# Patient Record
Sex: Male | Born: 1940 | Race: White | Hispanic: No | Marital: Married | Smoking: Former smoker
Health system: Southern US, Community
[De-identification: ages and names within clinical notes are randomized; demographics above are authoritative.]

## PROBLEM LIST (undated history)

## (undated) DIAGNOSIS — N289 Disorder of kidney and ureter, unspecified: Secondary | ICD-10-CM

## (undated) DIAGNOSIS — E7800 Pure hypercholesterolemia, unspecified: Secondary | ICD-10-CM

## (undated) DIAGNOSIS — C801 Malignant (primary) neoplasm, unspecified: Secondary | ICD-10-CM

## (undated) DIAGNOSIS — I10 Essential (primary) hypertension: Secondary | ICD-10-CM

## (undated) HISTORY — PX: TONSILLECTOMY: SUR1361

## (undated) HISTORY — PX: PROSTATE BIOPSY: SHX241

## (undated) HISTORY — PX: HEMORRHOID SURGERY: SHX153

---

## 2005-03-01 HISTORY — PX: HERNIA REPAIR: SHX51

## 2016-02-24 ENCOUNTER — Other Ambulatory Visit (HOSPITAL_COMMUNITY): Payer: Self-pay | Admitting: Urology

## 2016-02-24 DIAGNOSIS — C61 Malignant neoplasm of prostate: Secondary | ICD-10-CM

## 2016-03-05 ENCOUNTER — Ambulatory Visit (HOSPITAL_COMMUNITY)
Admission: RE | Admit: 2016-03-05 | Discharge: 2016-03-05 | Disposition: A | Discharge status: Home / Self Care | Payer: Medicare Other | Source: Ambulatory Visit | Attending: Urology | Admitting: Urology

## 2016-03-05 DIAGNOSIS — C61 Malignant neoplasm of prostate: Secondary | ICD-10-CM | POA: Diagnosis not present

## 2016-03-05 LAB — POCT I-STAT CREATININE: CREATININE: 1.3 mg/dL — AB (ref 0.61–1.24)

## 2016-03-05 MED ORDER — GADOBENATE DIMEGLUMINE 529 MG/ML IV SOLN
20.0000 mL | Freq: Once | INTRAVENOUS | Status: AC | PRN
  Administered 2016-03-05: 20 mL via INTRAVENOUS

## 2016-03-31 ENCOUNTER — Encounter (HOSPITAL_BASED_OUTPATIENT_CLINIC_OR_DEPARTMENT_OTHER): Payer: Self-pay

## 2016-03-31 ENCOUNTER — Emergency Department (HOSPITAL_BASED_OUTPATIENT_CLINIC_OR_DEPARTMENT_OTHER)
Admission: EM | Admit: 2016-03-31 | Discharge: 2016-03-31 | Disposition: A | Discharge status: Home / Self Care | Payer: Medicare Other | Attending: Emergency Medicine | Admitting: Emergency Medicine

## 2016-03-31 DIAGNOSIS — R319 Hematuria, unspecified: Secondary | ICD-10-CM | POA: Diagnosis not present

## 2016-03-31 DIAGNOSIS — Z8546 Personal history of malignant neoplasm of prostate: Secondary | ICD-10-CM | POA: Diagnosis not present

## 2016-03-31 DIAGNOSIS — R1030 Lower abdominal pain, unspecified: Secondary | ICD-10-CM | POA: Diagnosis not present

## 2016-03-31 DIAGNOSIS — R339 Retention of urine, unspecified: Secondary | ICD-10-CM | POA: Diagnosis not present

## 2016-03-31 DIAGNOSIS — Z87891 Personal history of nicotine dependence: Secondary | ICD-10-CM | POA: Diagnosis not present

## 2016-03-31 DIAGNOSIS — N183 Chronic kidney disease, stage 3 (moderate): Secondary | ICD-10-CM | POA: Diagnosis not present

## 2016-03-31 DIAGNOSIS — I129 Hypertensive chronic kidney disease with stage 1 through stage 4 chronic kidney disease, or unspecified chronic kidney disease: Secondary | ICD-10-CM | POA: Insufficient documentation

## 2016-03-31 HISTORY — DX: Pure hypercholesterolemia, unspecified: E78.00

## 2016-03-31 HISTORY — DX: Essential (primary) hypertension: I10

## 2016-03-31 HISTORY — DX: Malignant (primary) neoplasm, unspecified: C80.1

## 2016-03-31 HISTORY — DX: Disorder of kidney and ureter, unspecified: N28.9

## 2016-03-31 MED ORDER — LIDOCAINE HCL 2 % EX GEL
CUTANEOUS | Status: AC
  Filled 2016-03-31: qty 20

## 2016-03-31 NOTE — ED Notes (Signed)
ED Provider at bedside. 

## 2016-03-31 NOTE — ED Notes (Signed)
Pt verbalizes understanding of d/c instructions and denies any further needs at this time. 

## 2016-03-31 NOTE — ED Notes (Signed)
Assisted S. Vogt with F/C and 732mls of red urine

## 2016-03-31 NOTE — ED Notes (Signed)
Foley is still draining urine, no clots present and the urine is clearing up from the frank red when the catheter was first placed.

## 2016-03-31 NOTE — Discharge Instructions (Signed)
Keep the catheter in place until you see Dr. Venia Minks. Return to the ED if you develop new or worsening symptoms.

## 2016-03-31 NOTE — ED Notes (Signed)
Pt. Reports he had a procedure yesterday has not urinated since 1600 yesterday.  Pt. Reports when he was urinating it had multiple clots.  Pt. Reports he feels full of urine.  Pt. Has prostate cancer.

## 2016-03-31 NOTE — ED Triage Notes (Signed)
Pt had a prostate biopsy this morning at 1030, drank lots of fluid and was able to urinate post-procedure, stopped being able to urinate around 1800.  Pt saw Dr. Rosario Adie today for the procedure.  Pt denies difficulty with urinary retention prior to tonight.

## 2016-03-31 NOTE — ED Notes (Signed)
Family at bedside. 

## 2016-03-31 NOTE — ED Provider Notes (Signed)
[facility] Provider Note   CSN: [id number] Arrival date & time: 03/31/16  0017     History   Chief Complaint Chief Complaint  Patient presents with  . Urinary Retention    HPI Preston Rodriguez is a 76 y.o. male.  Patient with history of prostate cancer status post transrectal biopsy today presenting with urinary retention since 4 PM. States he did have blood clots after the procedure. Has now been unable to urinate since about 4 PM. He's had nausea but no vomiting. He has been drinking increased fluids without relief. He denies any chest pain, shortness of breath, fever or chills. He has some lower abdominal fullness and back pain. No focal weakness. Do not have any previous issues with urinary retention prior to this biopsy. He reports nothing was inserted his penis during the procedure.   The history is provided by the patient.    Past Medical History:  Diagnosis Date  . Cancer [facility])    prostate  . High cholesterol   . Hypertension   . Renal disorder    stage three kidney disease    There are no active problems to display for this patient.   Past Surgical History:  Procedure Laterality Date  . HEMORRHOID SURGERY    . HERNIA REPAIR  2007   right groin  . PROSTATE BIOPSY    . TONSILLECTOMY         Home Medications    Prior to Admission medications   Medication Sig Start Date End Date Taking? Authorizing Provider  ezetimibe (ZETIA) 10 MG tablet Take 10 mg by mouth daily.   Yes Historical Provider, MD  fluticasone (FLOVENT DISKUS) 50 MCG/BLIST diskus inhaler Inhale 1 puff into the lungs 2 (two) times daily.   Yes Historical Provider, MD  losartan (COZAAR) 25 MG tablet Take 25 mg by mouth daily.   Yes Historical Provider, MD  triamterene-hydrochlorothiazide (DYAZIDE) 37.5-25 MG capsule Take 1 capsule by mouth daily.   Yes Historical Provider, MD    Family History No family history on file.  Social History Social History  Substance Use Topics  .  Smoking status: Former Research scientist (life sciences)  . Smokeless tobacco: Never Used  . Alcohol use No     Allergies   Statins   Review of Systems Review of Systems  Constitutional: Negative for activity change, appetite change and fever.  Respiratory: Negative for cough, chest tightness and shortness of breath.   Cardiovascular: Negative for chest pain.  Gastrointestinal: Positive for abdominal pain. Negative for nausea and vomiting.  Genitourinary: Positive for decreased urine volume, difficulty urinating, frequency and hematuria.  Musculoskeletal: Negative for arthralgias and myalgias.  Neurological: Negative for dizziness, tremors and headaches.   A complete 10 system review of systems was obtained and all systems are negative except as noted in the HPI and PMH.   Physical Exam Updated Vital Signs BP 148/80 (BP Location: Left Arm)   Pulse 68   Temp 98.3 F (36.8 C) (Oral)   Resp 16   Ht 6' (1.829 m)   Wt 200 lb (90.7 kg)   SpO2 99%   BMI 27.12 kg/m   Physical Exam  Constitutional: He is oriented to person, place, and time. He appears well-developed and well-nourished. No distress.  uncomfortable  HENT:  Head: Normocephalic and atraumatic.  Mouth/Throat: Oropharynx is clear and moist. No oropharyngeal exudate.  Eyes: Conjunctivae and EOM are normal. Pupils are equal, round, and reactive to light.  Neck: Normal range of motion. Neck supple.  No meningismus.  Cardiovascular: Normal rate, regular rhythm, normal heart sounds and intact distal pulses.   No murmur heard. Pulmonary/Chest: Effort normal and breath sounds normal. No respiratory distress.  Abdominal: Soft. There is tenderness. There is no rebound and no guarding.  Suprapubic tenderness  Genitourinary:  Genitourinary Comments: No testicular tenderness  no blood at meatus  Musculoskeletal: Normal range of motion. He exhibits no edema or tenderness.  Neurological: He is alert and oriented to person, place, and time. No cranial  nerve deficit. He exhibits normal muscle tone. Coordination normal.  No ataxia on finger to nose bilaterally. No pronator drift. 5/5 strength throughout. CN 2-12 intact.Equal grip strength. Sensation intact.   Skin: Skin is warm.  Psychiatric: He has a normal mood and affect. His behavior is normal.  Nursing note and vitals reviewed.    ED Treatments / Results  Labs (all labs ordered are listed, but only abnormal results are displayed) Labs Reviewed - No data to display  EKG  EKG Interpretation None       Radiology No results found.  Procedures Procedures (including critical care time)  Medications Ordered in ED Medications - No data to display   Initial Impression / Assessment and Plan / ED Course  I have reviewed the triage vital signs and the nursing notes.  Pertinent labs & imaging results that were available during my care of the patient were reviewed by me and considered in my medical decision making (see chart for details).     Urinary retention after transrectal prostate biopsy today. Uncomfortable. Suspect blood clot causing occlusion of urethra.  Foley placed with >800 ml of red urine.  Irrigated and flushes without clots.   Leg bag placed.  Will discharge with catheter in place to follow up with urologist.  Patient feels improved after catheter placed.  Return precautions discussed.  Final Clinical Impressions(s) / ED Diagnoses   Final diagnoses:  Urinary retention    New Prescriptions New Prescriptions   No medications on file     Ezequiel Essex, MD 03/31/16 [phone]

## 2016-08-29 DEATH — deceased

## 2018-01-26 IMAGING — MR MR PROSTATE WO/W CM
55 series · 55 of 55 positions shown · IV contrast (20 MH)
Comparison: None.

CLINICAL DATA: Rising PSA equal 6.48. Prior prostate gland biopsy
with recent 3+ 3 equals 6 carcinoma.

EXAM:
MR PROSTATE WITHOUT AND WITH CONTRAST
TECHNIQUE: Multiplanar multisequence MRI images were obtained of the pelvis
centered about the prostate. Pre and post contrast images were
obtained.
CONTRAST:  20mL MULTIHANCE GADOBENATE DIMEGLUMINE 529 MG/ML IV SOLN

[Series 6: T1 · axial · 8.0mm · 0.78mm/px · 1 of 30 slices shown (1 of 2)]
[im 1/30]
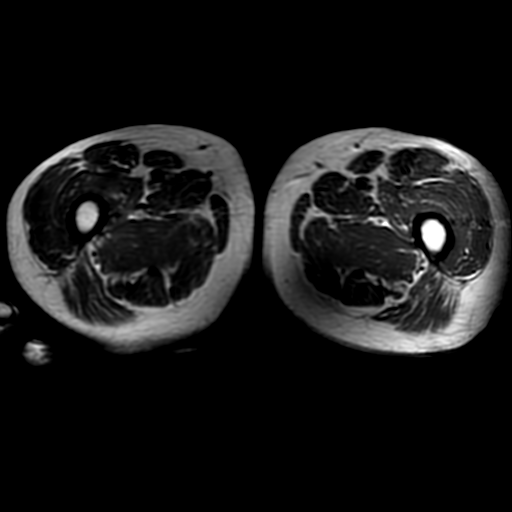

[Series 7: bSSFP fat-sat · axial · 8.0mm · 0.78mm/px · 1 of 30 slices shown]
[im 1/30]
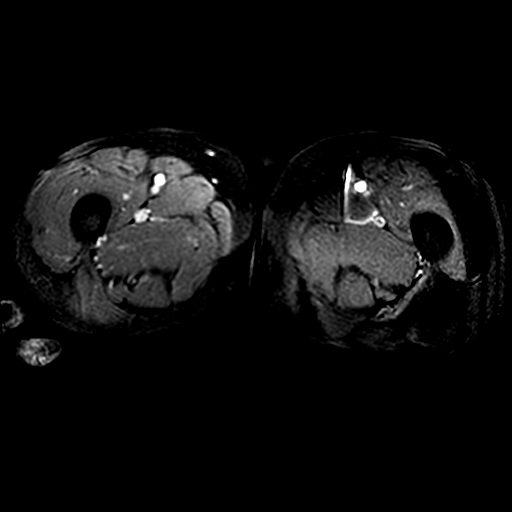

[Series 9: T2 · axial · 3.0mm · 0.62mm/px · 1 of 24 slices shown (1 of 3)]
[im 1/24]
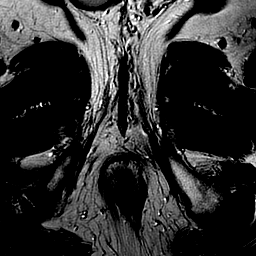

[Series 10: DWI · axial · 3.0mm · 0.70mm/px · 1 of 45 slices shown]
[im 1/45]
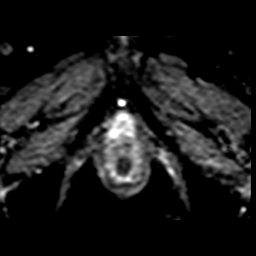

[Series 11: T2 · coronal · 3.0mm · 0.62mm/px · 1 of 24 slices shown (2 of 3)]
[im 1/24]
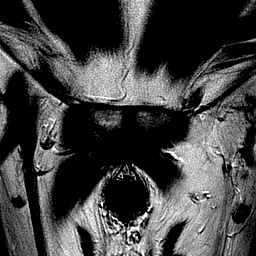

[Series 12: T1 · axial · 3.0mm · 0.31mm/px · 1 of 24 slices shown (2 of 2)]
[im 1/24]
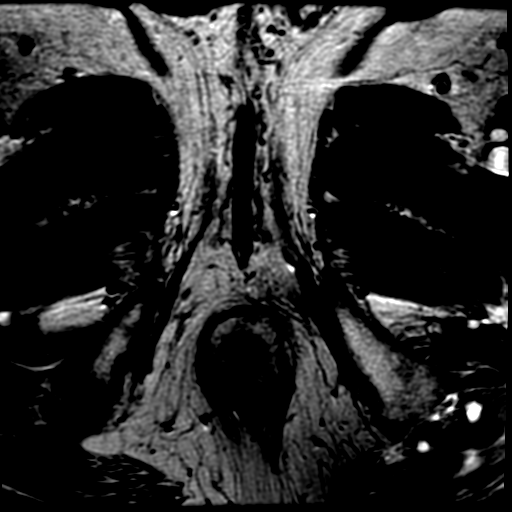

[Series 13: T2 · sagittal · 3.5mm · 0.62mm/px · 1 of 32 slices shown (3 of 3)]
[im 1/32]
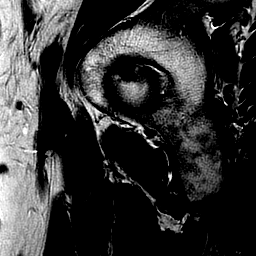

[Series 1050: ADC · axial · 3.0mm · 0.70mm/px · 1 of 15 slices shown]
[im 1/15]
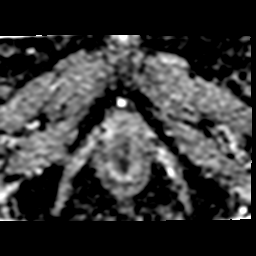

[Series 1400: T1 dynamic · axial · 4.0mm · 0.94mm/px · 1 of 26 slices shown (1 of 24)]
[im 1/26]
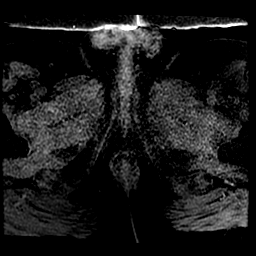

[Series 1401: T1 dynamic · axial · 4.0mm · 0.94mm/px · 1 of 26 slices shown (2 of 24)]
[im 1/26]
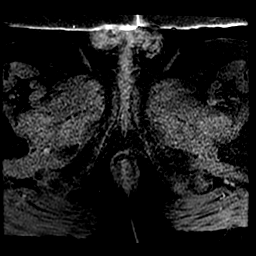

[Series 1402: T1 dynamic · axial · 4.0mm · 0.94mm/px · 1 of 26 slices shown (3 of 24)]
[im 1/26]
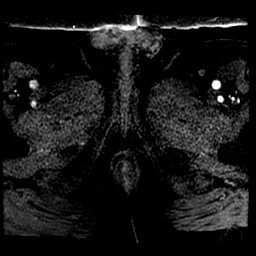

[Series 1403: T1 dynamic · axial · 4.0mm · 0.94mm/px · 1 of 26 slices shown (4 of 24)]
[im 1/26]
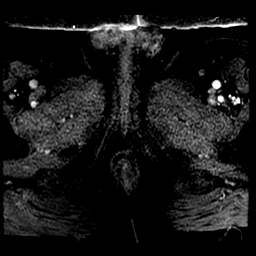

[Series 1404: T1 dynamic · axial · 4.0mm · 0.94mm/px · 1 of 26 slices shown (5 of 24)]
[im 1/26]
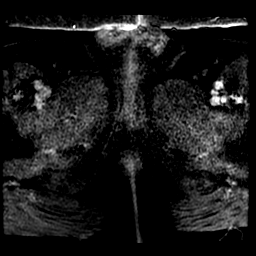

[Series 1405: T1 dynamic · axial · 4.0mm · 0.94mm/px · 1 of 26 slices shown (6 of 24)]
[im 1/26]
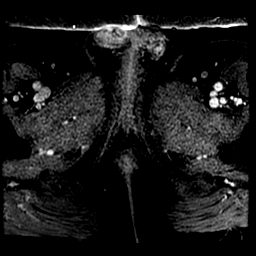

[Series 1406: T1 dynamic · axial · 4.0mm · 0.94mm/px · 1 of 26 slices shown (7 of 24)]
[im 1/26]
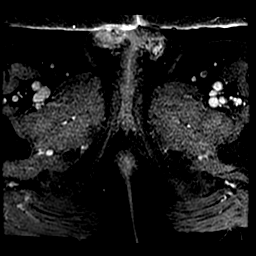

[Series 1407: T1 dynamic · axial · 4.0mm · 0.94mm/px · 1 of 26 slices shown (8 of 24)]
[im 1/26]
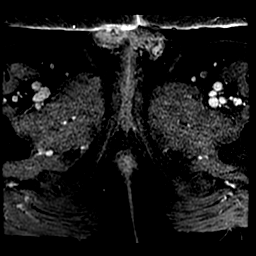

[Series 1408: T1 dynamic · axial · 4.0mm · 0.94mm/px · 1 of 26 slices shown (9 of 24)]
[im 1/26]
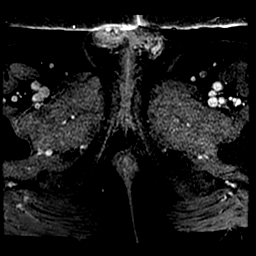

[Series 1409: T1 dynamic · axial · 4.0mm · 0.94mm/px · 1 of 26 slices shown (10 of 24)]
[im 1/26]
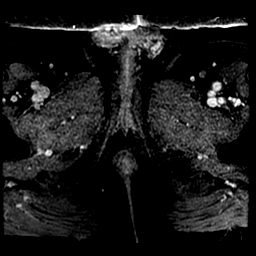

[Series 1410: T1 dynamic · axial · 4.0mm · 0.94mm/px · 1 of 26 slices shown (11 of 24)]
[im 1/26]
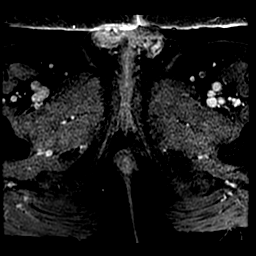

[Series 1411: T1 dynamic · axial · 4.0mm · 0.94mm/px · 1 of 26 slices shown (12 of 24)]
[im 1/26]
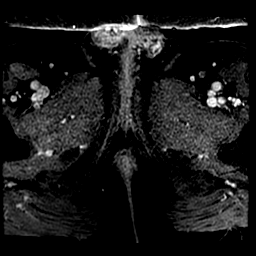

[Series 1412: T1 dynamic · axial · 4.0mm · 0.94mm/px · 1 of 26 slices shown (13 of 24)]
[im 1/26]
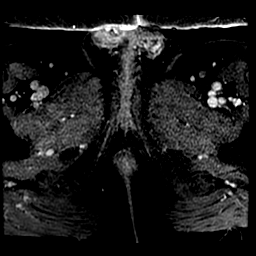

[Series 1413: T1 dynamic · axial · 4.0mm · 0.94mm/px · 1 of 26 slices shown (14 of 24)]
[im 1/26]
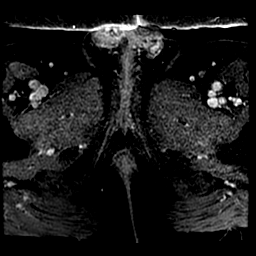

[Series 1414: T1 dynamic · axial · 4.0mm · 0.94mm/px · 1 of 26 slices shown (15 of 24)]
[im 1/26]
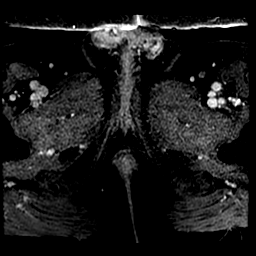

[Series 1415: T1 dynamic · axial · 4.0mm · 0.94mm/px · 1 of 26 slices shown (16 of 24)]
[im 1/26]
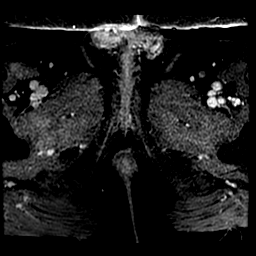

[Series 1416: T1 dynamic · axial · 4.0mm · 0.94mm/px · 1 of 26 slices shown (17 of 24)]
[im 1/26]
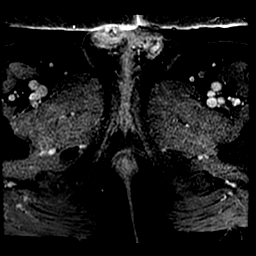

[Series 1417: T1 dynamic · axial · 4.0mm · 0.94mm/px · 1 of 26 slices shown (18 of 24)]
[im 1/26]
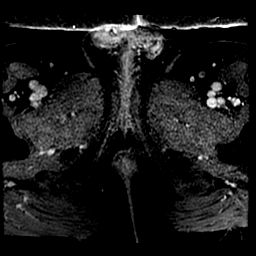

[Series 1418: T1 dynamic · axial · 4.0mm · 0.94mm/px · 1 of 26 slices shown (19 of 24)]
[im 1/26]
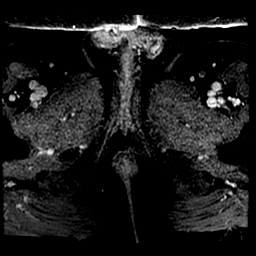

[Series 1419: T1 dynamic · axial · 4.0mm · 0.94mm/px · 1 of 26 slices shown (20 of 24)]
[im 1/26]
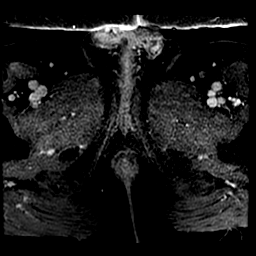

[Series 1420: T1 dynamic · axial · 4.0mm · 0.94mm/px · 1 of 26 slices shown (21 of 24)]
[im 1/26]
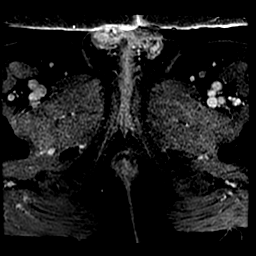

[Series 1421: T1 dynamic · axial · 4.0mm · 0.94mm/px · 1 of 26 slices shown (22 of 24)]
[im 1/26]
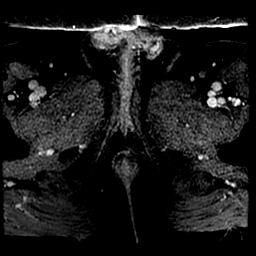

[Series 1422: T1 dynamic · axial · 4.0mm · 0.94mm/px · 1 of 26 slices shown (23 of 24)]
[im 1/26]
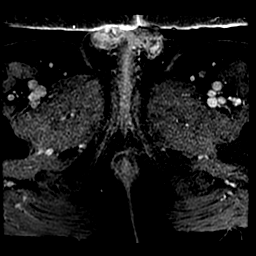

[Series 1423: T1 dynamic · axial · 4.0mm · 0.94mm/px · 1 of 26 slices shown (24 of 24)]
[im 1/26]
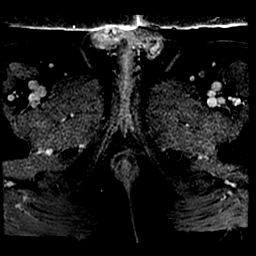

[((id)/(id)/1)-((id)/(id)/1) · axial · 4.0mm · 0.94mm/px · 1 of 9 slices shown (1 of 23)]
[im 1/9]
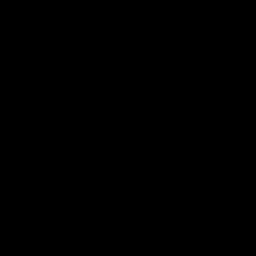

[((id)/(id)/1)-((id)/(id)/1) · axial · 4.0mm · 0.94mm/px · 1 of 19 slices shown (2 of 23)]
[im 1/19]
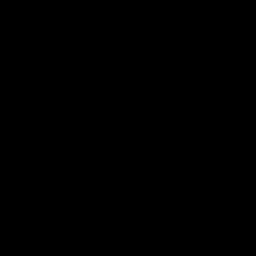

[((id)/(id)/1)-((id)/(id)/1) · axial · 4.0mm · 0.94mm/px · 1 of 26 slices shown (3 of 23)]
[im 1/26]
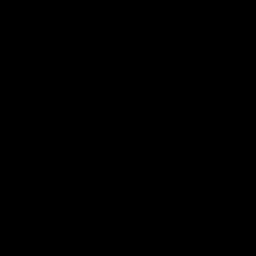

[((id)/(id)/1)-((id)/(id)/1) · axial · 4.0mm · 0.94mm/px · 1 of 14 slices shown (4 of 23)]
[im 1/14]
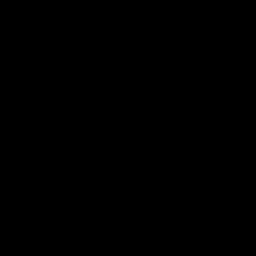

[((id)/(id)/1)-((id)/(id)/1) · axial · 4.0mm · 0.94mm/px · 1 of 22 slices shown (5 of 23)]
[im 1/22]
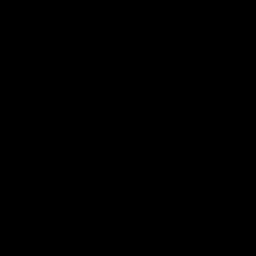

[((id)/(id)/1)-((id)/(id)/1) · axial · 4.0mm · 0.94mm/px · 1 of 21 slices shown (6 of 23)]
[im 1/21]
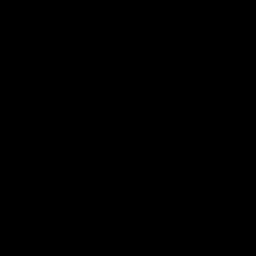

[((id)/(id)/1)-((id)/(id)/1) · axial · 4.0mm · 0.94mm/px · 1 of 23 slices shown (7 of 23)]
[im 1/23]
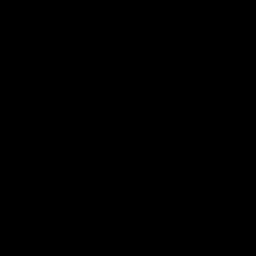

[((id)/(id)/1)-((id)/(id)/1) · axial · 4.0mm · 0.94mm/px · 1 of 26 slices shown (8 of 23)]
[im 1/26]
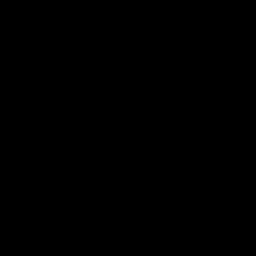

[((id)/(id)/1)-((id)/(id)/1) · axial · 4.0mm · 0.94mm/px · 1 of 24 slices shown (9 of 23)]
[im 1/24]
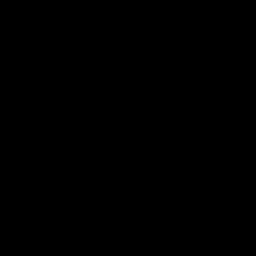

[((id)/(id)/1)-((id)/(id)/1) · axial · 4.0mm · 0.94mm/px · 1 of 25 slices shown (10 of 23)]
[im 1/25]
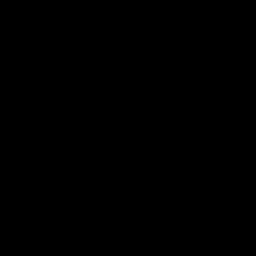

[((id)/(id)/1)-((id)/(id)/1) · axial · 4.0mm · 0.94mm/px · 1 of 25 slices shown (11 of 23)]
[im 1/25]
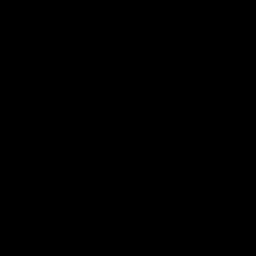

[((id)/(id)/1)-((id)/(id)/1) · axial · 4.0mm · 0.94mm/px · 1 of 26 slices shown (12 of 23)]
[im 1/26]
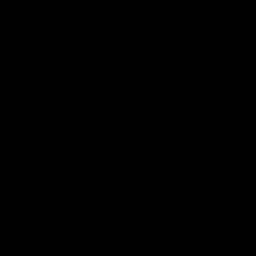

[((id)/(id)/1)-((id)/(id)/1) · axial · 4.0mm · 0.94mm/px · 1 of 26 slices shown (13 of 23)]
[im 1/26]
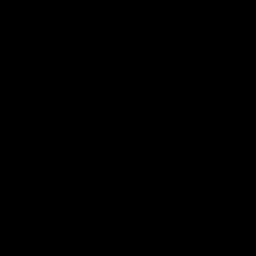

[((id)/(id)/1)-((id)/(id)/1) · axial · 4.0mm · 0.94mm/px · 1 of 26 slices shown (14 of 23)]
[im 1/26]
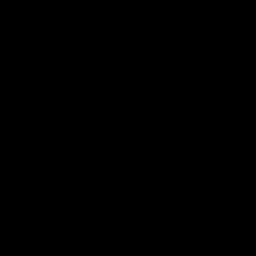

[((id)/(id)/1)-((id)/(id)/1) · axial · 4.0mm · 0.94mm/px · 1 of 24 slices shown (15 of 23)]
[im 1/24]
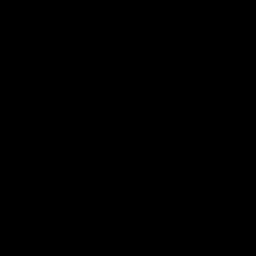

[((id)/(id)/1)-((id)/(id)/1) · axial · 4.0mm · 0.94mm/px · 1 of 25 slices shown (16 of 23)]
[im 1/25]
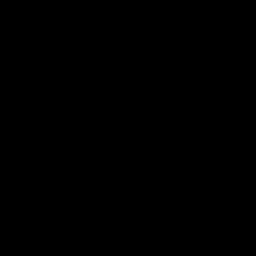

[((id)/(id)/1)-((id)/(id)/1) · axial · 4.0mm · 0.94mm/px · 1 of 25 slices shown (17 of 23)]
[im 1/25]
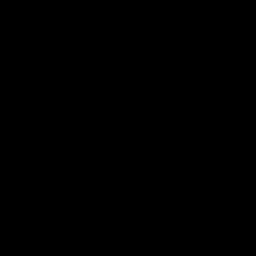

[((id)/(id)/1)-((id)/(id)/1) · axial · 4.0mm · 0.94mm/px · 1 of 24 slices shown (18 of 23)]
[im 1/24]
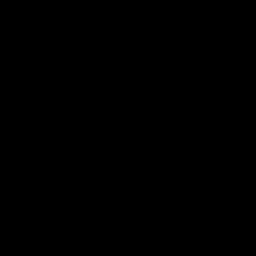

[((id)/(id)/1)-((id)/(id)/1) · axial · 4.0mm · 0.94mm/px · 1 of 25 slices shown (19 of 23)]
[im 1/25]
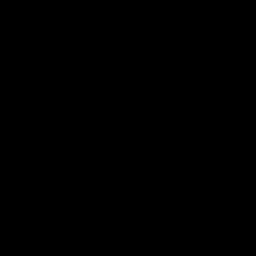

[((id)/(id)/1)-((id)/(id)/1) · axial · 4.0mm · 0.94mm/px · 1 of 24 slices shown (20 of 23)]
[im 1/24]
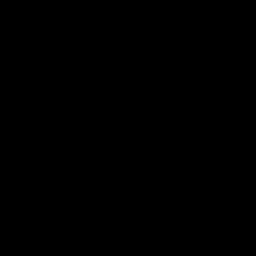

[((id)/(id)/1)-((id)/(id)/1) · axial · 4.0mm · 0.94mm/px · 1 of 25 slices shown (21 of 23)]
[im 1/25]
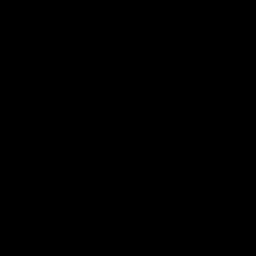

[((id)/(id)/1)-((id)/(id)/1) · axial · 4.0mm · 0.94mm/px · 1 of 25 slices shown (22 of 23)]
[im 1/25]
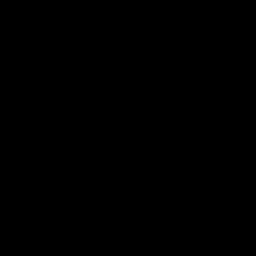

[((id)/(id)/1)-((id)/(id)/1) · axial · 4.0mm · 0.94mm/px · 1 of 25 slices shown (23 of 23)]
[im 1/25]
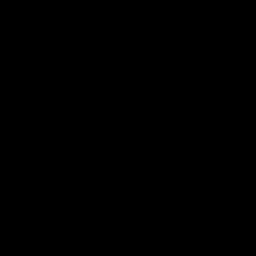

[55 of 55 positions shown; findings below may reference images not displayed]

FINDINGS: Exam is suboptimal as patient was uncomfortable in the scanner and
imaging degraded by patient motion.

Prostate: The diffusion-weighted images are particularly degraded.

On the T2 weighted axial series 9, focus of low signal intensity
within the peripheral zone of the LEFT mid gland measures 10 mm x 8
mm (image 9 series 9). There is potential mild restricted diffusion
this level (image 41, series 3505).

Focus of loss of signal intensity on the ADC map measuring 9 mm by 8
mm within the RIGHT mid gland anteriorly (image 40, series 3505)
corresponds to a low-density lesion at junction of the peripheral
zone and transitional zone in the RIGHT base on T2 weighted image 9
series series 9.

Postcontrast imaging demonstrates enhancement at the LEFT apex but
no corresponding CT abnormality.

The transitional zone is small. The prostatic capsule appears
intact. The neurovascular bundles appear normal. Some vesicles are
normal.

Transcapsular spread:  Present/absent/other

Seminal vesicle involvement: present/absent/other

Neurovascular bundle involvement: present/absent/other

Pelvic adenopathy: present/absent/other

Bone metastasis: present/absent/other

Other findings: Gland measures 4.8 by 3.2 by 3.6 cm (volume = 29
cm^3).
IMPRESSION: 1. Lesion in the anterior RIGHT mid gland is concerning for
high-grade carcinoma (PI-RADS 4)
2. Lesion in the LEFT mid gland is indeterminate for high-grade
carcinoma (PI rads 3)
3. Prostatic capsule appears intact.
